# Patient Record
Sex: Female | Born: 1990 | Race: White | Hispanic: No | Marital: Single | Smoking: Never smoker
Health system: Southern US, Community
[De-identification: ages and names within clinical notes are randomized; demographics above are authoritative.]

## PROBLEM LIST (undated history)

## (undated) DIAGNOSIS — H539 Unspecified visual disturbance: Secondary | ICD-10-CM

## (undated) DIAGNOSIS — J069 Acute upper respiratory infection, unspecified: Secondary | ICD-10-CM

## (undated) DIAGNOSIS — L509 Urticaria, unspecified: Secondary | ICD-10-CM

## (undated) DIAGNOSIS — J4530 Mild persistent asthma, uncomplicated: Secondary | ICD-10-CM

## (undated) HISTORY — PX: SINOSCOPY: SHX187

## (undated) HISTORY — DX: Mild persistent asthma, uncomplicated: J45.30

## (undated) HISTORY — DX: Urticaria, unspecified: L50.9

## (undated) HISTORY — DX: Acute upper respiratory infection, unspecified: J06.9

## (undated) HISTORY — DX: Unspecified visual disturbance: H53.9

---

## 2010-12-30 HISTORY — PX: UPPER GI ENDOSCOPY: SHX6162

## 2016-04-16 ENCOUNTER — Encounter: Payer: Self-pay | Admitting: Allergy and Immunology

## 2016-04-16 ENCOUNTER — Ambulatory Visit (INDEPENDENT_AMBULATORY_CARE_PROVIDER_SITE_OTHER): Payer: BLUE CROSS/BLUE SHIELD | Admitting: Allergy and Immunology

## 2016-04-16 VITALS — BP 110/76 | HR 80 | Temp 98.1°F | Resp 16 | Ht 68.7 in | Wt 204.6 lb

## 2016-04-16 DIAGNOSIS — J3089 Other allergic rhinitis: Secondary | ICD-10-CM

## 2016-04-16 DIAGNOSIS — J339 Nasal polyp, unspecified: Secondary | ICD-10-CM | POA: Diagnosis not present

## 2016-04-16 DIAGNOSIS — J4530 Mild persistent asthma, uncomplicated: Secondary | ICD-10-CM

## 2016-04-16 MED ORDER — ALBUTEROL SULFATE 108 (90 BASE) MCG/ACT IN AEPB: 2.0000 | INHALATION_SPRAY | RESPIRATORY_TRACT | Status: AC | PRN

## 2016-04-16 MED ORDER — AZELASTINE HCL 0.15 % NA SOLN: NASAL | Status: AC

## 2016-04-16 MED ORDER — LEVOCETIRIZINE DIHYDROCHLORIDE 5 MG PO TABS: ORAL_TABLET | ORAL | Status: AC

## 2016-04-16 MED ORDER — MONTELUKAST SODIUM 10 MG PO TABS: ORAL_TABLET | ORAL | Status: AC

## 2016-04-16 NOTE — Assessment & Plan Note (Addendum)
   Aeroallergen avoidance measures have been discussed and provided in written form.  A prescription has been provided for azelastine nasal spray, 1-2 sprays per nostril 2 times daily as needed. Proper nasal spray technique has been discussed and demonstrated.   Continue fluticasone, 2 sprays per nostril daily as needed.  I have also recommended nasal saline spray (i.e. Simply Saline) as needed prior to medicated nasal sprays. A prescription has been provided for montelukast 10 mg daily at bedtime.  A prescription has been provided for levocetirizine, 5 mg daily as needed.  The risks and benefits of aeroallergen immunotherapy have been discussed. The patient is motivated to initiate immunotherapy to reduce symptoms and decrease medication requirement. Informed consent has been signed and allergen vaccine orders have been submitted. Medications will be decreased or discontinued as symptom relief from immunotherapy becomes evident.

## 2016-04-16 NOTE — Progress Notes (Signed)
New Patient Note  RE: Gina Ruiz MRN: [id number] DOB: [birth year] Date of Office Visit: 04/16/2016  Referring provider: Estanislado Pandy, MD Primary care provider: Estanislado Pandy, MD  Chief Complaint: Allergic Rhinitis  and Sinus Problem   History of present illness: HPI Comments: Gina Ruiz is a 25 y.o. female presenting today for consultation of rhinosinusitis.  Seen reports that she has suffered from allergies her "whole life."  She complains of nasal congestion, rhinorrhea, thick postnasal drainage, ear fullness, and sinus pressure over the forehead.  The symptoms occur year around but her most severe in the fall.  She had sinus surgery with polypectomy in March 2016.  Despite the surgery she has had 3 or 4 sinus infections requiring antibiotics and/or steroids since that time.  She has tried multiple antihistamines and prescription nasal sprays.  She reports that the only prescription nasal spray which provides adequate relief is Dymista, however her insurance does not cover this medication.  She currently uses fluticasone nasal spray without adequate symptom relief.  She had been started on aeroallergen immunotherapy last year but discontinued buildup injections in November due to her move from [place] to [place].  She has a history of migraine headaches which seem to be triggered by sinus pressure. She experiences chest tightness and wheezing with upper respiratory infections.   Assessment and plan: Perennial and seasonal allergic rhinitis  Aeroallergen avoidance measures have been discussed and provided in written form.  A prescription has been provided for azelastine nasal spray, 1-2 sprays per nostril 2 times daily as needed. Proper nasal spray technique has been discussed and demonstrated.   Continue fluticasone, 2 sprays per nostril daily as needed.  I have also recommended nasal saline spray (i.e. Simply Saline) as needed prior to medicated nasal sprays. A prescription  has been provided for montelukast 10 mg daily at bedtime.  A prescription has been provided for levocetirizine, 5 mg daily as needed.  The risks and benefits of aeroallergen immunotherapy have been discussed. The patient is motivated to initiate immunotherapy to reduce symptoms and decrease medication requirement. Informed consent has been signed and allergen vaccine orders have been submitted. Medications will be decreased or discontinued as symptom relief from immunotherapy becomes evident.  Mild persistent asthma Today's spirometry results, assessed while asymptomatic, suggest under-perception of dyspnea.  A prescription has been provided for montelukast (as above).  A prescription has been provided for ProAir Respiclick, 1-2 inhalations every 4-6 hours as needed.  Subjective and objective measures of pulmonary function will be followed and the treatment plan will be adjusted accordingly.  History of nasal polyps  Gina Ruiz has been encouraged to use nasal steroid spray daily.    Meds ordered this encounter  Medications  . Albuterol Sulfate (PROAIR RESPICLICK) 108 (90 Base) MCG/ACT AEPB    Sig: Inhale 2 puffs into the lungs every 4 (four) hours as needed. for cough or wheeze.  May use 2 puffs 10-20 minutes prior to exercise.    Dispense:  1 each    Refill:  1  . Azelastine HCl 0.15 % SOLN    Sig: Use 1-2 sprays in each nostril twice daily as needed for congestion.    Dispense:  30 mL    Refill:  5  . montelukast (SINGULAIR) 10 MG tablet    Sig: Take one tablet once daily in the evening to prevent cough or wheeze.    Dispense:  30 tablet    Refill:  5  . levocetirizine (XYZAL) 5 MG tablet  Sig: Take one tablet once daily for runny nose or itching.    Dispense:  30 tablet    Refill:  5    Diagnositics: Spirometry: FVC was 3.77 L (89% predicted) and FEV1 was 2.38 L (65% predicted) with significant (470 mL, 19%) postbronchodilator improvement. Allergy skin testing: Positive to  grass pollen, ragweed pollen, weed pollen, tree pollen, cat hair, dog epithelia, and dust mite antigen.    Physical examination: Blood pressure 110/76, pulse 80, temperature 98.1 F (36.7 C), temperature source Oral, resp. rate 16, height 5' 8.7" (1.745 m), weight 204 lb 9.4 oz (92.8 kg), SpO2 97 %.  General: Alert, interactive, in no acute distress. HEENT: TMs pearly gray, turbinates edematous without discharge, post-pharynx moderately erythematous. Neck: Supple without lymphadenopathy. Lungs: Clear to auscultation without wheezing, rhonchi or rales. CV: Normal S1, S2 without murmurs. Abdomen: Nondistended, nontender. Skin: Warm and dry, without lesions or rashes. Extremities:  No clubbing, cyanosis or edema. Neuro:   Grossly intact.  Review of systems:  Review of Systems  Constitutional: Negative for fever, chills and weight loss.  HENT: Positive for congestion. Negative for nosebleeds.   Eyes: Negative for blurred vision.  Respiratory: Positive for cough. Negative for hemoptysis, shortness of breath and wheezing.   Cardiovascular: Negative for chest pain.  Gastrointestinal: Negative for diarrhea and constipation.  Genitourinary: Negative for dysuria.  Musculoskeletal: Negative for myalgias and joint pain.  Skin: Negative for itching and rash.  Neurological: Positive for headaches. Negative for dizziness.  Endo/Heme/Allergies: Positive for environmental allergies. Does not bruise/bleed easily.    Past medical history:  Past Medical History  Diagnosis Date  . Recurrent upper respiratory infection (URI)   . Urticaria     Past surgical history:  Past Surgical History  Procedure Laterality Date  . Sinoscopy      REMOVED NASAL POLYPS  . Upper gi endoscopy  12/2010    Family history: Family History  Problem Relation Age of Onset  . Allergic rhinitis Father   . Asthma Neg Hx   . Angioedema Neg Hx   . Immunodeficiency Neg Hx   . Urticaria Neg Hx   . Food Allergy Child    . Eczema Child     Social history: Social History   Social History  . Marital Status: Single    Spouse Name: N/A  . Number of Children: N/A  . Years of Education: N/A   Occupational History  . Not on file.   Social History Main Topics  . Smoking status: Never Smoker   . Smokeless tobacco: Never Used  . Alcohol Use: Not on file  . Drug Use: Not on file  . Sexual Activity: Not on file   Other Topics Concern  . Not on file   Social History Narrative  . No narrative on file   Environmental History: The patient lives in a 25 year old house with carpeting in the bedroom, gas heat, and central air.  There is a dog in house which has access to her bedroom.  She is a nonsmoker and is not exposed to significant secondhand cigarette smoke.    Medication List       This list is accurate as of: 04/16/16  1:10 PM.  Always use your most recent med list.               Albuterol Sulfate 108 (90 Base) MCG/ACT Aepb  Commonly known as:  PROAIR RESPICLICK  Inhale 2 puffs into the lungs every 4 (four) hours as needed. for  cough or wheeze.  May use 2 puffs 10-20 minutes prior to exercise.     Azelastine HCl 0.15 % Soln  Use 1-2 sprays in each nostril twice daily as needed for congestion.     BENADRYL 25 mg capsule  Generic drug:  diphenhydrAMINE  Take 25 mg by mouth every 6 (six) hours as needed.     fluticasone 50 MCG/ACT nasal spray  Commonly known as:  FLONASE  Place 2 sprays into both nostrils daily.     levocetirizine 5 MG tablet  Commonly known as:  XYZAL  Take one tablet once daily for runny nose or itching.     montelukast 10 MG tablet  Commonly known as:  SINGULAIR  Take one tablet once daily in the evening to prevent cough or wheeze.     phentermine 37.5 MG tablet  Commonly known as:  ADIPEX-P  Take 37.5 mg by mouth daily.     prenatal multivitamin Tabs tablet  Take 1 tablet by mouth daily at 12 noon.        Known medication allergies: Allergies    Allergen Reactions  . Amoxicillin Anaphylaxis    I appreciate the opportunity to take part in this Azka's care. Please do not hesitate to contact me with questions.  Sincerely,   R. Jorene Guestarter Brittannie Tawney, MD

## 2016-04-16 NOTE — Assessment & Plan Note (Addendum)
Today's spirometry results, assessed while asymptomatic, suggest under-perception of dyspnea.  A prescription has been provided for montelukast (as above).  A prescription has been provided for ProAir Respiclick, 1-2 inhalations every 4-6 hours as needed.  Subjective and objective measures of pulmonary function will be followed and the treatment plan will be adjusted accordingly. 

## 2016-04-16 NOTE — Patient Instructions (Addendum)
Perennial and seasonal allergic rhinitis  Aeroallergen avoidance measures have been discussed and provided in written form.  A prescription has been provided for azelastine nasal spray, 1-2 sprays per nostril 2 times daily as needed. Proper nasal spray technique has been discussed and demonstrated.   Continue fluticasone, 2 sprays per nostril daily as needed.  I have also recommended nasal saline spray (i.e. Simply Saline) as needed prior to medicated nasal sprays. A prescription has been provided for montelukast 10 mg daily at bedtime.  A prescription has been provided for levocetirizine, 5 mg daily as needed.  The risks and benefits of aeroallergen immunotherapy have been discussed. The patient is motivated to initiate immunotherapy to reduce symptoms and decrease medication requirement. Informed consent has been signed and allergen vaccine orders have been submitted. Medications will be decreased or discontinued as symptom relief from immunotherapy becomes evident.  Mild persistent asthma Today's spirometry results, assessed while asymptomatic, suggest under-perception of dyspnea.  A prescription has been provided for montelukast (as above).  A prescription has been provided for ProAir Respiclick, 1-2 inhalations every 4-6 hours as needed.  Subjective and objective measures of pulmonary function will be followed and the treatment plan will be adjusted accordingly.  History of nasal polyps  Arline AspCindy has been encouraged to use nasal steroid spray daily.    Return in about 4 months (around 08/16/2016), or if symptoms worsen or fail to improve.  Reducing Pollen Exposure  The American Academy of Allergy, Asthma and Immunology suggests the following steps to reduce your exposure to pollen during allergy seasons.    1. Do not hang sheets or clothing out to dry; pollen may collect on these items. 2. Do not mow lawns or spend time around freshly cut grass; mowing stirs up pollen. 3. Keep  windows closed at night.  Keep car windows closed while driving. 4. Minimize morning activities outdoors, a time when pollen counts are usually at their highest. 5. Stay indoors as much as possible when pollen counts or humidity is high and on windy days when pollen tends to remain in the air longer. 6. Use air conditioning when possible.  Many air conditioners have filters that trap the pollen spores. 7. Use a HEPA room air filter to remove pollen form the indoor air you breathe.   Control of House Dust Mite Allergen  House dust mites play a major role in allergic asthma and rhinitis.  They occur in environments with high humidity wherever human skin, the food for dust mites is found. High levels have been detected in dust obtained from mattresses, pillows, carpets, upholstered furniture, bed covers, clothes and soft toys.  The principal allergen of the house dust mite is found in its feces.  A gram of dust may contain 1,000 mites and 250,000 fecal particles.  Mite antigen is easily measured in the air during house cleaning activities.    1. Encase mattresses, including the box spring, and pillow, in an air tight cover.  Seal the zipper end of the encased mattresses with wide adhesive tape. 2. Wash the bedding in water of 130 degrees Farenheit weekly.  Avoid cotton comforters/quilts and flannel bedding: the most ideal bed covering is the dacron comforter. 3. Remove all upholstered furniture from the bedroom. 4. Remove carpets, carpet padding, rugs, and non-washable window drapes from the bedroom.  Wash drapes weekly or use plastic window coverings. 5. Remove all non-washable stuffed toys from the bedroom.  Wash stuffed toys weekly. 6. Have the room cleaned frequently with a  vacuum cleaner and a damp dust-mop.  The patient should not be in a room which is being cleaned and should wait 1 hour after cleaning before going into the room. 7. Close and seal all heating outlets in the bedroom.  Otherwise,  the room will become filled with dust-laden air.  An electric heater can be used to heat the room. 8. Reduce indoor humidity to less than 50%.  Do not use a humidifier.  Control of Dog or Cat Allergen  Avoidance is the best way to manage a dog or cat allergy. If you have a dog or cat and are allergic to dog or cats, consider removing the dog or cat from the home. If you have a dog or cat but don't want to find it a new home, or if your family wants a pet even though someone in the household is allergic, here are some strategies that may help keep symptoms at bay:  1. Keep the pet out of your bedroom and restrict it to only a few rooms. Be advised that keeping the dog or cat in only one room will not limit the allergens to that room. 2. Don't pet, hug or kiss the dog or cat; if you do, wash your hands with soap and water. 3. High-efficiency particulate air (HEPA) cleaners run continuously in a bedroom or living room can reduce allergen levels over time. 4. Regular use of a high-efficiency vacuum cleaner or a central vacuum can reduce allergen levels. 5. Giving your dog or cat a bath at least once a week can reduce airborne allergen.

## 2016-04-16 NOTE — Assessment & Plan Note (Signed)
   Arline AspCindy has been encouraged to use nasal steroid spray daily.

## 2016-04-18 DIAGNOSIS — J301 Allergic rhinitis due to pollen: Secondary | ICD-10-CM

## 2016-04-19 DIAGNOSIS — J3089 Other allergic rhinitis: Secondary | ICD-10-CM

## 2016-08-27 ENCOUNTER — Other Ambulatory Visit: Payer: Self-pay | Admitting: Otolaryngology

## 2016-08-27 DIAGNOSIS — H9313 Tinnitus, bilateral: Secondary | ICD-10-CM

## 2016-09-26 ENCOUNTER — Encounter: Payer: Self-pay | Admitting: Radiology

## 2016-09-26 ENCOUNTER — Ambulatory Visit
Admission: RE | Admit: 2016-09-26 | Discharge: 2016-09-26 | Disposition: A | Discharge status: Home / Self Care | Payer: BLUE CROSS/BLUE SHIELD | Source: Ambulatory Visit | Attending: Otolaryngology | Admitting: Otolaryngology

## 2016-09-26 DIAGNOSIS — H9313 Tinnitus, bilateral: Secondary | ICD-10-CM

## 2016-09-26 MED ORDER — GADOBENATE DIMEGLUMINE 529 MG/ML IV SOLN
19.0000 mL | Freq: Once | INTRAVENOUS | Status: AC | PRN
  Administered 2016-09-26: 19 mL via INTRAVENOUS

## 2016-10-10 ENCOUNTER — Ambulatory Visit (INDEPENDENT_AMBULATORY_CARE_PROVIDER_SITE_OTHER): Payer: BLUE CROSS/BLUE SHIELD | Admitting: Neurology

## 2016-10-10 ENCOUNTER — Telehealth: Payer: Self-pay | Admitting: Neurology

## 2016-10-10 ENCOUNTER — Encounter: Payer: Self-pay | Admitting: Neurology

## 2016-10-10 DIAGNOSIS — IMO0002 Reserved for concepts with insufficient information to code with codable children: Secondary | ICD-10-CM | POA: Insufficient documentation

## 2016-10-10 DIAGNOSIS — G43709 Chronic migraine without aura, not intractable, without status migrainosus: Secondary | ICD-10-CM

## 2016-10-10 DIAGNOSIS — R42 Dizziness and giddiness: Secondary | ICD-10-CM | POA: Diagnosis not present

## 2016-10-10 MED ORDER — RIZATRIPTAN BENZOATE 5 MG PO TBDP: 5.0000 mg | ORAL_TABLET | ORAL | 6 refills | Status: AC | PRN

## 2016-10-10 MED ORDER — TOPIRAMATE 50 MG PO TABS: 50.0000 mg | ORAL_TABLET | Freq: Two times a day (BID) | ORAL | 11 refills | Status: AC

## 2016-10-10 NOTE — Telephone Encounter (Signed)
Please call patient, it is rarely happens.  we may follow-up on the triglycerides and uric acid level when she comes back, with she have frequent migraine and vertigo, it is worse for her to try

## 2016-10-10 NOTE — Telephone Encounter (Signed)
What would you like me to tell her?

## 2016-10-10 NOTE — Telephone Encounter (Signed)
LM with recommendations below. Left call back number for further questions.  

## 2016-10-10 NOTE — Telephone Encounter (Signed)
Pt called said she was reading trh side effects for topmax and it could cause high uric acid and high triglycerides and she already has both of these. Please call

## 2016-10-10 NOTE — Progress Notes (Signed)
PATIENT: Gina Ruiz DOB: 04/27/1991  Chief Complaint  Patient presents with  . Dizziness    Gina Ruiz is here for eval of intermittent dizziness onset about 2 yrs. ago.  Worse with position changes. Sts. no relief with Valium, Antivert or Brandt-Daroff exercises.Gina Ruiz     HISTORICAL  Gina Ruiz is a 25 years old right-handed female, seen in refer by her primary care PA Lovey NewcomerWilliam S Boyd for evaluation of frequent headaches, vertigo spells, initial evaluation was October twelfth 2017   She reported a long-standing history of migraine headaches since elementary school, for a while, she was treated with preventive medications, since 2015, she noticed increased frequency of headaches, almost daily mild to moderate headache, involving different part, lasting few minutes to hours, about once a month she would get her typical migraine severe pounding headache with associated light noise sensitivity, nauseous, she has to lie in dark quiet room, she often take over-the-counter BC powders, which helps her headache most of the time,  In addition to her frequent headaches she also complains of dizziness since 2015, gradually getting worse, especially since 2016, she had frequent set onset vertigo spells, nauseous, difficulty focusing, but only mild headache, no hearing loss, no tinnitus,  She was evaluated by ENT no significant pathology found, because of frequent vertigo, she presented to local emergency room multiple times, has taking many days off work, in one spell in September, she has to miss work 7 days in a row.  We personally reviewed MRI of the brain with and without contrast on September 26 2016, there was no significant abnormality.   REVIEW OF SYSTEMS: Full 14 system review of systems performed and notable only for dizziness, memory loss, headaches, allergy, frequent infection, easy bruising, blurry vision, snoring, spinning sensation, fatigue  ALLERGIES: Allergies  Allergen  Reactions  . Amoxicillin Anaphylaxis    HOME MEDICATIONS: Current Outpatient Prescriptions  Medication Sig Dispense Refill  . Albuterol Sulfate (PROAIR RESPICLICK) 108 (90 Base) MCG/ACT AEPB Inhale 2 puffs into the lungs every 4 (four) hours as needed. for cough or wheeze.  May use 2 puffs 10-20 minutes prior to exercise. 1 each 1  . fluticasone (FLONASE) 50 MCG/ACT nasal spray Place 2 sprays into both nostrils daily.    . Azelastine HCl 0.15 % SOLN Use 1-2 sprays in each nostril twice daily as needed for congestion. (Patient not taking: Reported on 10/10/2016) 30 mL 5  . diphenhydrAMINE (BENADRYL) 25 mg capsule Take 25 mg by mouth every 6 (six) hours as needed.    Marland Kitchen. levocetirizine (XYZAL) 5 MG tablet Take one tablet once daily for runny nose or itching. (Patient not taking: Reported on 10/10/2016) 30 tablet 5  . montelukast (SINGULAIR) 10 MG tablet Take one tablet once daily in the evening to prevent cough or wheeze. (Patient not taking: Reported on 10/10/2016) 30 tablet 5  . phentermine (ADIPEX-P) 37.5 MG tablet Take 37.5 mg by mouth daily.  0  . Prenatal Vit-Fe Fumarate-FA (PRENATAL MULTIVITAMIN) TABS tablet Take 1 tablet by mouth daily at 12 noon.     No current facility-administered medications for this visit.     PAST MEDICAL HISTORY: Past Medical History:  Diagnosis Date  . Mild persistent asthma   . Recurrent upper respiratory infection (URI)   . Urticaria   . Vision abnormalities     PAST SURGICAL HISTORY: Past Surgical History:  Procedure Laterality Date  . SINOSCOPY     REMOVED NASAL POLYPS  . UPPER GI ENDOSCOPY  12/2010    FAMILY HISTORY: Family History  Problem Relation Age of Onset  . Allergic rhinitis Father   . Healthy Father   . Food Allergy Child   . Eczema Child   . Healthy Mother   . Asthma Neg Hx   . Angioedema Neg Hx   . Immunodeficiency Neg Hx   . Urticaria Neg Hx     SOCIAL HISTORY:  Social History   Social History  . Marital status:  Single    Spouse name: N/A  . Number of children: N/A  . Years of education: N/A   Occupational History  . banker   Social History Main Topics  . Smoking status: Never Smoker  . Smokeless tobacco: Never Used  . Alcohol use Not on file  . Drug use: Unknown  . Sexual activity: Not on file   Other Topics Concern  . Not on file   Social History Narrative  . No narrative on file     PHYSICAL EXAM   Vitals:   10/10/16 0958  BP: 122/76  Pulse: 80  Resp: 16  Weight: 208 lb (94.3 kg)  Height: 5\' 9"  (1.753 m)    Not recorded      Body mass index is 30.72 kg/m.  PHYSICAL EXAMNIATION:  Gen: NAD, conversant, well nourised, obese, well groomed                     Cardiovascular: Regular rate rhythm, no peripheral edema, warm, nontender. Eyes: Conjunctivae clear without exudates or hemorrhage Neck: Supple, no carotid bruise. Pulmonary: Clear to auscultation bilaterally   NEUROLOGICAL EXAM:  MENTAL STATUS: Speech:    Speech is normal; fluent and spontaneous with normal comprehension.  Cognition:     Orientation to time, place and person     Normal recent and remote memory     Normal Attention span and concentration     Normal Language, naming, repeating,spontaneous speech     Fund of knowledge   CRANIAL NERVES: CN II: Visual fields are full to confrontation. Fundoscopic exam is normal with sharp discs and no vascular changes. Pupils are round equal and briskly reactive to light. CN III, IV, VI: extraocular movement are normal. No ptosis. CN V: Facial sensation is intact to pinprick in all 3 divisions bilaterally. Corneal responses are intact.  CN VII: Face is symmetric with normal eye closure and smile. CN VIII: Hearing is normal to rubbing fingers CN IX, X: Palate elevates symmetrically. Phonation is normal. CN XI: Head turning and shoulder shrug are intact CN XII: Tongue is midline with normal movements and no atrophy.  MOTOR: There is no pronator drift of  out-stretched arms. Muscle bulk and tone are normal. Muscle strength is normal.  REFLEXES: Reflexes are 2+ and symmetric at the biceps, triceps, knees, and ankles. Plantar responses are flexor.  SENSORY: Intact to light touch, pinprick, positional sensation and vibratory sensation are intact in fingers and toes.  COORDINATION: Rapid alternating movements and fine finger movements are intact. There is no dysmetria on finger-to-nose and heel-knee-shin.    GAIT/STANCE: Posture is normal. Gait is steady with normal steps, base, arm swing, and turning. Heel and toe walking are normal. Tandem gait is normal.  Romberg is absent.   DIAGNOSTIC DATA (LABS, IMAGING, TESTING) - I reviewed patient records, labs, notes, testing and imaging myself where available.   ASSESSMENT AND PLAN  Gina Ruiz is a 25 y.o. female   chronic migraine,  vertigo variant Normal MRI of  brain with without contrast Start preventive medications Topamax titrating to 100 mg twice a day Maxalt as needed Return to clinic in 2 months    Levert Feinstein, M.D. Ph.D.  [facility] [address], [address] [place], [place] [address] Ph: [phone] Fax: [phone]  CC: Lovey Newcomer, PA

## 2016-10-18 ENCOUNTER — Emergency Department (HOSPITAL_BASED_OUTPATIENT_CLINIC_OR_DEPARTMENT_OTHER)
Admission: EM | Admit: 2016-10-18 | Discharge: 2016-10-18 | Disposition: A | Discharge status: Home / Self Care | Payer: BLUE CROSS/BLUE SHIELD | Attending: Emergency Medicine | Admitting: Emergency Medicine

## 2016-10-18 ENCOUNTER — Encounter (HOSPITAL_BASED_OUTPATIENT_CLINIC_OR_DEPARTMENT_OTHER): Payer: Self-pay | Admitting: *Deleted

## 2016-10-18 ENCOUNTER — Emergency Department (HOSPITAL_BASED_OUTPATIENT_CLINIC_OR_DEPARTMENT_OTHER): Payer: BLUE CROSS/BLUE SHIELD

## 2016-10-18 DIAGNOSIS — R55 Syncope and collapse: Secondary | ICD-10-CM | POA: Diagnosis not present

## 2016-10-18 DIAGNOSIS — R0602 Shortness of breath: Secondary | ICD-10-CM | POA: Insufficient documentation

## 2016-10-18 DIAGNOSIS — R079 Chest pain, unspecified: Secondary | ICD-10-CM | POA: Diagnosis present

## 2016-10-18 LAB — CBC
HEMATOCRIT: 40.3 % (ref 36.0–46.0)
Hemoglobin: 13.6 g/dL (ref 12.0–15.0)
MCH: 29.5 pg (ref 26.0–34.0)
MCHC: 33.7 g/dL (ref 30.0–36.0)
MCV: 87.4 fL (ref 78.0–100.0)
PLATELETS: 330 10*3/uL (ref 150–400)
RBC: 4.61 MIL/uL (ref 3.87–5.11)
RDW: 12.8 % (ref 11.5–15.5)
WBC: 8.4 10*3/uL (ref 4.0–10.5)

## 2016-10-18 LAB — BASIC METABOLIC PANEL
Anion gap: 6 (ref 5–15)
BUN: 12 mg/dL (ref 6–20)
CO2: 27 mmol/L (ref 22–32)
CREATININE: 0.72 mg/dL (ref 0.44–1.00)
Calcium: 9.5 mg/dL (ref 8.9–10.3)
Chloride: 104 mmol/L (ref 101–111)
GFR calc Af Amer: >60 mL/min (ref >60)
GLUCOSE: 109 mg/dL — AB (ref 65–99)
POTASSIUM: 4.1 mmol/L (ref 3.5–5.1)
SODIUM: 137 mmol/L (ref 135–145)

## 2016-10-18 LAB — TROPONIN I: Troponin I: <0.03 ng/mL (ref <0.03)

## 2016-10-18 LAB — HCG, SERUM, QUALITATIVE: Preg, Serum: NEGATIVE

## 2016-10-18 LAB — D-DIMER, QUANTITATIVE (NOT AT ARMC)

## 2016-10-18 NOTE — ED Notes (Signed)
Patient transported to X-ray 

## 2016-10-18 NOTE — ED Provider Notes (Signed)
[facility] Provider Note   CSN: [id number] Arrival date & time: 10/18/16  1306     History   Chief Complaint Chief Complaint  Patient presents with  . Chest Pain    HPI Gina Ruiz is a 25 y.o. female.  HPI Patient presents from Urgent care she will rule out PE.Patient states that she was at work today at 11:30 and felt a "jolt' to her central chest. She developed shortness of breath, lightheadedness, blurred vision and near syncope.she denied any chest pain. She states that er left arm became tingly. She states she's been having episodic leg cramping for the past few days. Denies any lower extremity swelling. States she continues to have lightheadedness and shortness of breath. Last weekend drove 5 hoursover 2 consecutive days for a total of 10 hours. She's not on any oral birth control.She does have a grandfathers had a blood clot in his leg previously.She has no personal history for thromboembolic disease. Denies cough, fever or chills. Past Medical History:  Diagnosis Date  . Mild persistent asthma   . Recurrent upper respiratory infection (URI)   . Urticaria   . Vision abnormalities     Patient Active Problem List   Diagnosis Date Noted  . Chronic migraine 10/10/2016  . Vertigo 10/10/2016  . Perennial and seasonal allergic rhinitis 04/16/2016  . Mild persistent asthma 04/16/2016  . History of nasal polyps 04/16/2016    Past Surgical History:  Procedure Laterality Date  . SINOSCOPY     REMOVED NASAL POLYPS  . UPPER GI ENDOSCOPY  12/2010    OB History    No data available       Home Medications    Prior to Admission medications   Medication Sig Start Date End Date Taking? Authorizing Provider  Albuterol Sulfate (PROAIR RESPICLICK) 108 (90 Base) MCG/ACT AEPB Inhale 2 puffs into the lungs every 4 (four) hours as needed. for cough or wheeze.  May use 2 puffs 10-20 minutes prior to exercise. 04/16/16   Cristal Ford, MD  Azelastine  HCl 0.15 % SOLN Use 1-2 sprays in each nostril twice daily as needed for congestion. Patient not taking: Reported on 10/10/2016 04/16/16   Cristal Ford, MD  diphenhydrAMINE (BENADRYL) 25 mg capsule Take 25 mg by mouth every 6 (six) hours as needed.    Historical Provider, MD  fluticasone (FLONASE) 50 MCG/ACT nasal spray Place 2 sprays into both nostrils daily.    Historical Provider, MD  levocetirizine (XYZAL) 5 MG tablet Take one tablet once daily for runny nose or itching. Patient not taking: Reported on 10/10/2016 04/16/16   Cristal Ford, MD  montelukast (SINGULAIR) 10 MG tablet Take one tablet once daily in the evening to prevent cough or wheeze. Patient not taking: Reported on 10/10/2016 04/16/16   Cristal Ford, MD  phentermine (ADIPEX-P) 37.5 MG tablet Take 37.5 mg by mouth daily. 02/05/16   Historical Provider, MD  Prenatal Vit-Fe Fumarate-FA (PRENATAL MULTIVITAMIN) TABS tablet Take 1 tablet by mouth daily at 12 noon.    Historical Provider, MD  rizatriptan (MAXALT-MLT) 5 MG disintegrating tablet Take 1 tablet (5 mg total) by mouth as needed. May repeat in 2 hours if needed 10/10/16   Levert Feinstein, MD  topiramate (TOPAMAX) 50 MG tablet Take 1 tablet (50 mg total) by mouth 2 (two) times daily. 10/10/16   Levert Feinstein, MD    Family History Family History  Problem Relation Age of Onset  . Allergic rhinitis Father   .  Healthy Father   . Food Allergy Child   . Eczema Child   . Healthy Mother   . Asthma Neg Hx   . Angioedema Neg Hx   . Immunodeficiency Neg Hx   . Urticaria Neg Hx     Social History Social History  Substance Use Topics  . Smoking status: Never Smoker  . Smokeless tobacco: Never Used  . Alcohol use Not on file     Allergies   Amoxicillin   Review of Systems Review of Systems  Constitutional: Negative for chills, fatigue and fever.  Respiratory: Positive for shortness of breath. Negative for cough and wheezing.   Cardiovascular: Negative for  chest pain, palpitations and leg swelling.  Gastrointestinal: Negative for abdominal pain, diarrhea and nausea.  Genitourinary: Negative for dysuria, flank pain and frequency.  Musculoskeletal: Negative for back pain and neck pain.  Skin: Negative for pallor and wound.  Neurological: Positive for dizziness and light-headedness. Negative for weakness, numbness and headaches.  All other systems reviewed and are negative.    Physical Exam Updated Vital Signs BP 101/81 (BP Location: Right Arm)   Pulse 89   Temp 98.1 F (36.7 C) (Oral)   Resp 18   Ht 5\' 9"  (1.753 m)   Wt 208 lb (94.3 kg)   LMP 09/09/2016   SpO2 100%   BMI 30.72 kg/m   Physical Exam  Constitutional: She is oriented to person, place, and time. She appears well-developed and well-nourished.  HENT:  Head: Normocephalic and atraumatic.  Mouth/Throat: Oropharynx is clear and moist.  Eyes: EOM are normal. Pupils are equal, round, and reactive to light.  Neck: Normal range of motion. Neck supple.  Cardiovascular: Normal rate, regular rhythm, normal heart sounds and intact distal pulses.  Exam reveals no gallop and no friction rub.   No murmur heard. Pulmonary/Chest: Effort normal and breath sounds normal. No respiratory distress. She has no wheezes. She has no rales. She exhibits no tenderness.  Abdominal: Soft. Bowel sounds are normal. There is no tenderness. There is no rebound and no guarding.  Musculoskeletal: Normal range of motion. She exhibits no edema or tenderness.  No lower extremity swelling, asymmetry or tenderness. 2+ dorsalis pedis and posterior tibial pulses.  Neurological: She is alert and oriented to person, place, and time.  5/5 motor in all extremities. Sensation is fully intact.  Skin: Skin is warm and dry. Capillary refill takes less than 2 seconds. No rash noted. No erythema.  Psychiatric: She has a normal mood and affect. Her behavior is normal.  Nursing note and vitals reviewed.    ED  Treatments / Results  Labs (all labs ordered are listed, but only abnormal results are displayed) Labs Reviewed  BASIC METABOLIC PANEL - Abnormal; Notable for the following:       Result Value   Glucose, Bld 109 (*)    All other components within normal limits  CBC  TROPONIN I  D-DIMER, QUANTITATIVE (NOT [facility])  HCG, SERUM, QUALITATIVE    EKG  EKG Interpretation  Date/Time:  Friday October 18 2016 13:12:33 EDT Ventricular Rate:  85 PR Interval:  138 QRS Duration: 86 QT Interval:  378 QTC Calculation: 449 R Axis:   30 Text Interpretation:  Normal sinus rhythm Normal ECG Confirmed by Ranae PalmsYELVERTON  MD, Justyce Baby ([id number]) on 10/18/2016 3:11:29 PM       Radiology Dg Chest 2 View  Result Date: 10/18/2016 CLINICAL DATA:  Chest pain, shortness of breath, left arm tingling EXAM: CHEST  2  VIEW COMPARISON:  09/10/2016 FINDINGS: The heart size and mediastinal contours are within normal limits. Both lungs are clear. The visualized skeletal structures are unremarkable. IMPRESSION: No active cardiopulmonary disease. Electronically Signed   By: Natasha Mead M.D.   On: 10/18/2016 15:35    Procedures Procedures (including critical care time)  Medications Ordered in ED Medications - No data to display   Initial Impression / Assessment and Plan / ED Course  I have reviewed the triage vital signs and the nursing notes.  Pertinent labs & imaging results that were available during my care of the patient were reviewed by me and considered in my medical decision making (see chart for details).  Clinical Course   Workup is essentially negative. No cause for the patient's symptoms determined. EKG without any abnormal findings. Normal troponin and d-dimer. Electrolytes are normal. Advised follow-up with her primary physician and return precautions given. Advised drinking plenty fluids and change positions slowly.   Final Clinical Impressions(s) / ED Diagnoses   Final diagnoses:  Near syncope     New Prescriptions Discharge Medication List as of 10/18/2016  5:09 PM       Loren Racer, MD 10/18/16 2352

## 2016-10-18 NOTE — ED Triage Notes (Signed)
She went to UC today with chest pain, feeling she could not breathe and episode of getting hot. She had and EKG and her left arm got tingly, cramps in her left leg. She was told to come here to r/o blood clot. States she drove 10 hours in the car over the weekend.

## 2016-12-17 ENCOUNTER — Ambulatory Visit: Payer: BLUE CROSS/BLUE SHIELD | Admitting: Neurology

## 2016-12-17 ENCOUNTER — Telehealth: Payer: Self-pay | Admitting: *Deleted

## 2016-12-17 NOTE — Telephone Encounter (Signed)
No showed follow up appointment. 

## 2016-12-18 ENCOUNTER — Encounter: Payer: Self-pay | Admitting: Neurology

## 2017-06-11 IMAGING — CR DG CHEST 2V
2 series · 2 of 2 positions shown · non-contrast
Comparison: 09/10/2016

CLINICAL DATA: Chest pain, shortness of breath, left arm tingling

EXAM:
CHEST  2 VIEW

[w chest pa]
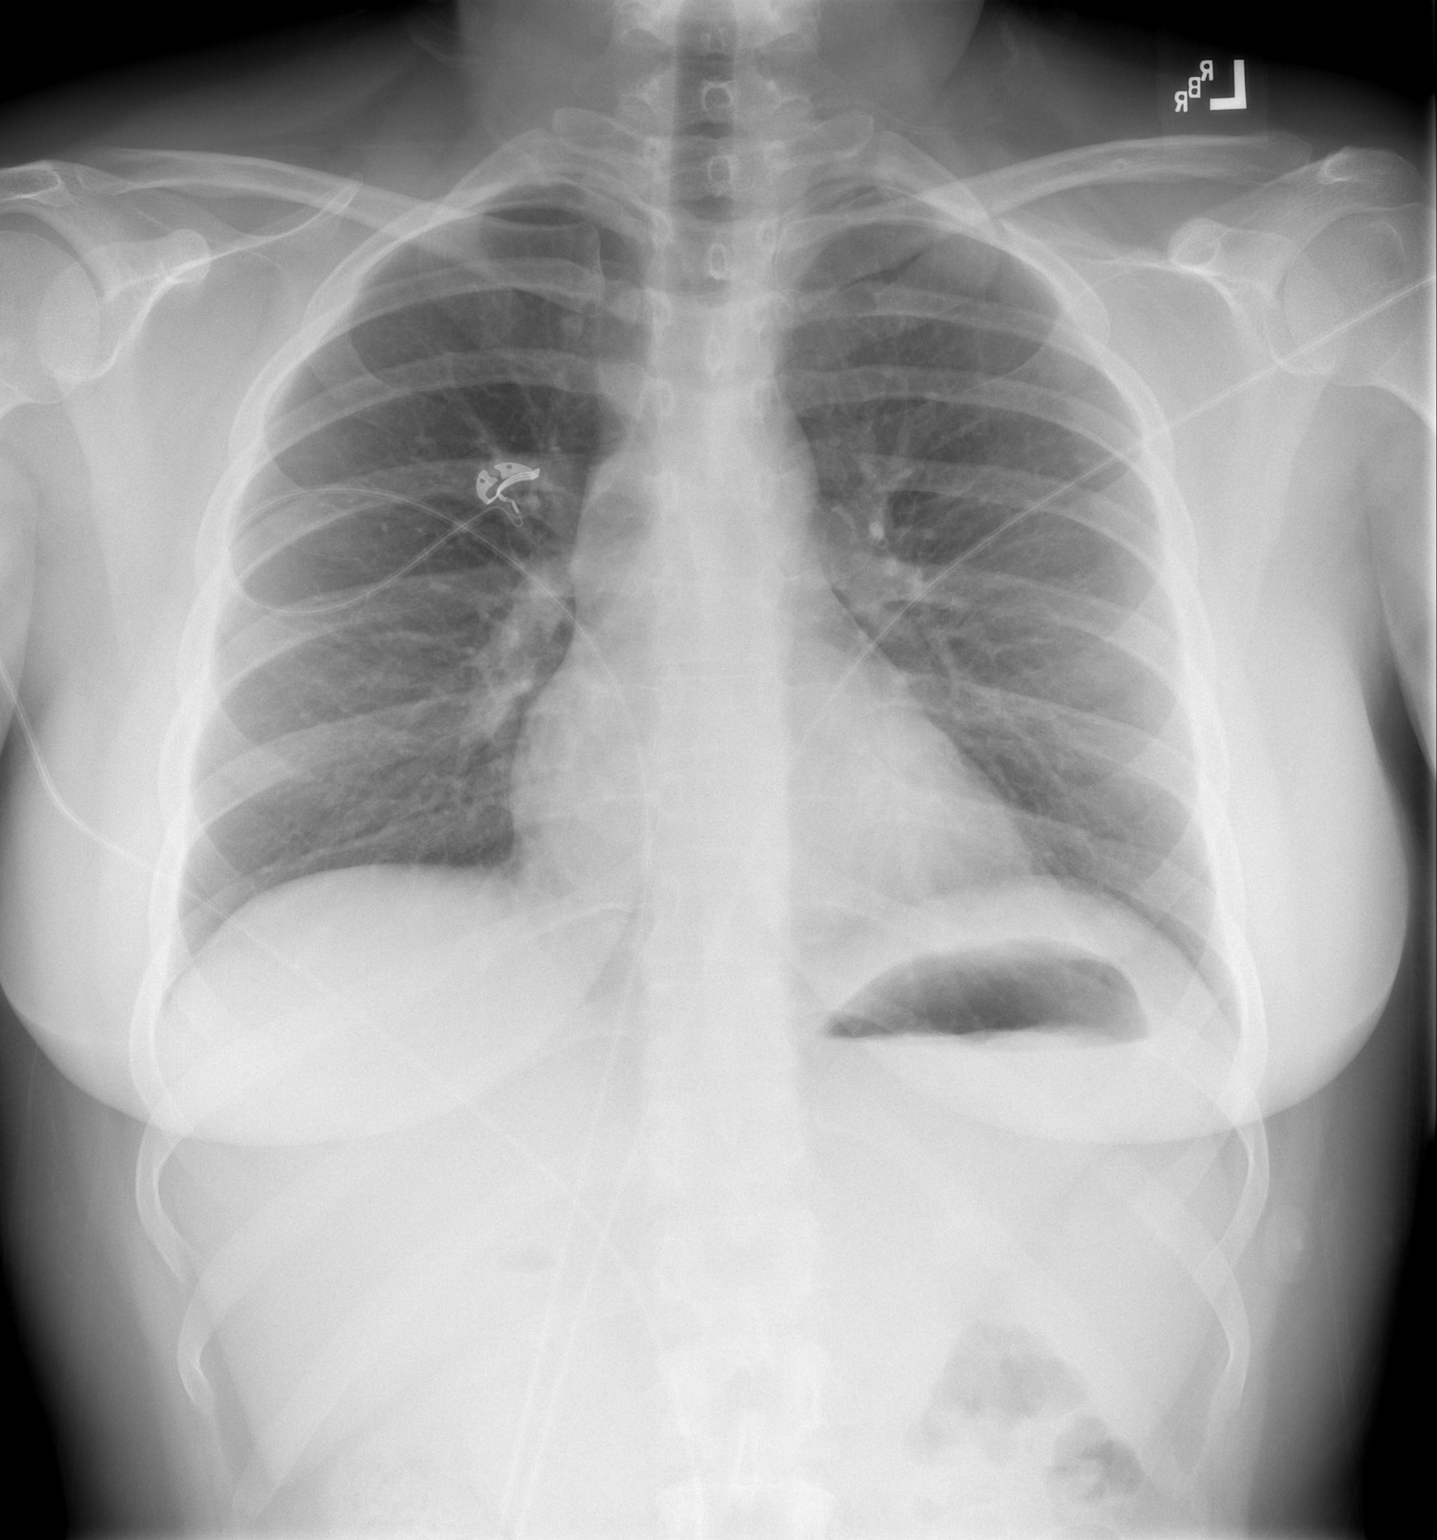

[w chest lat]
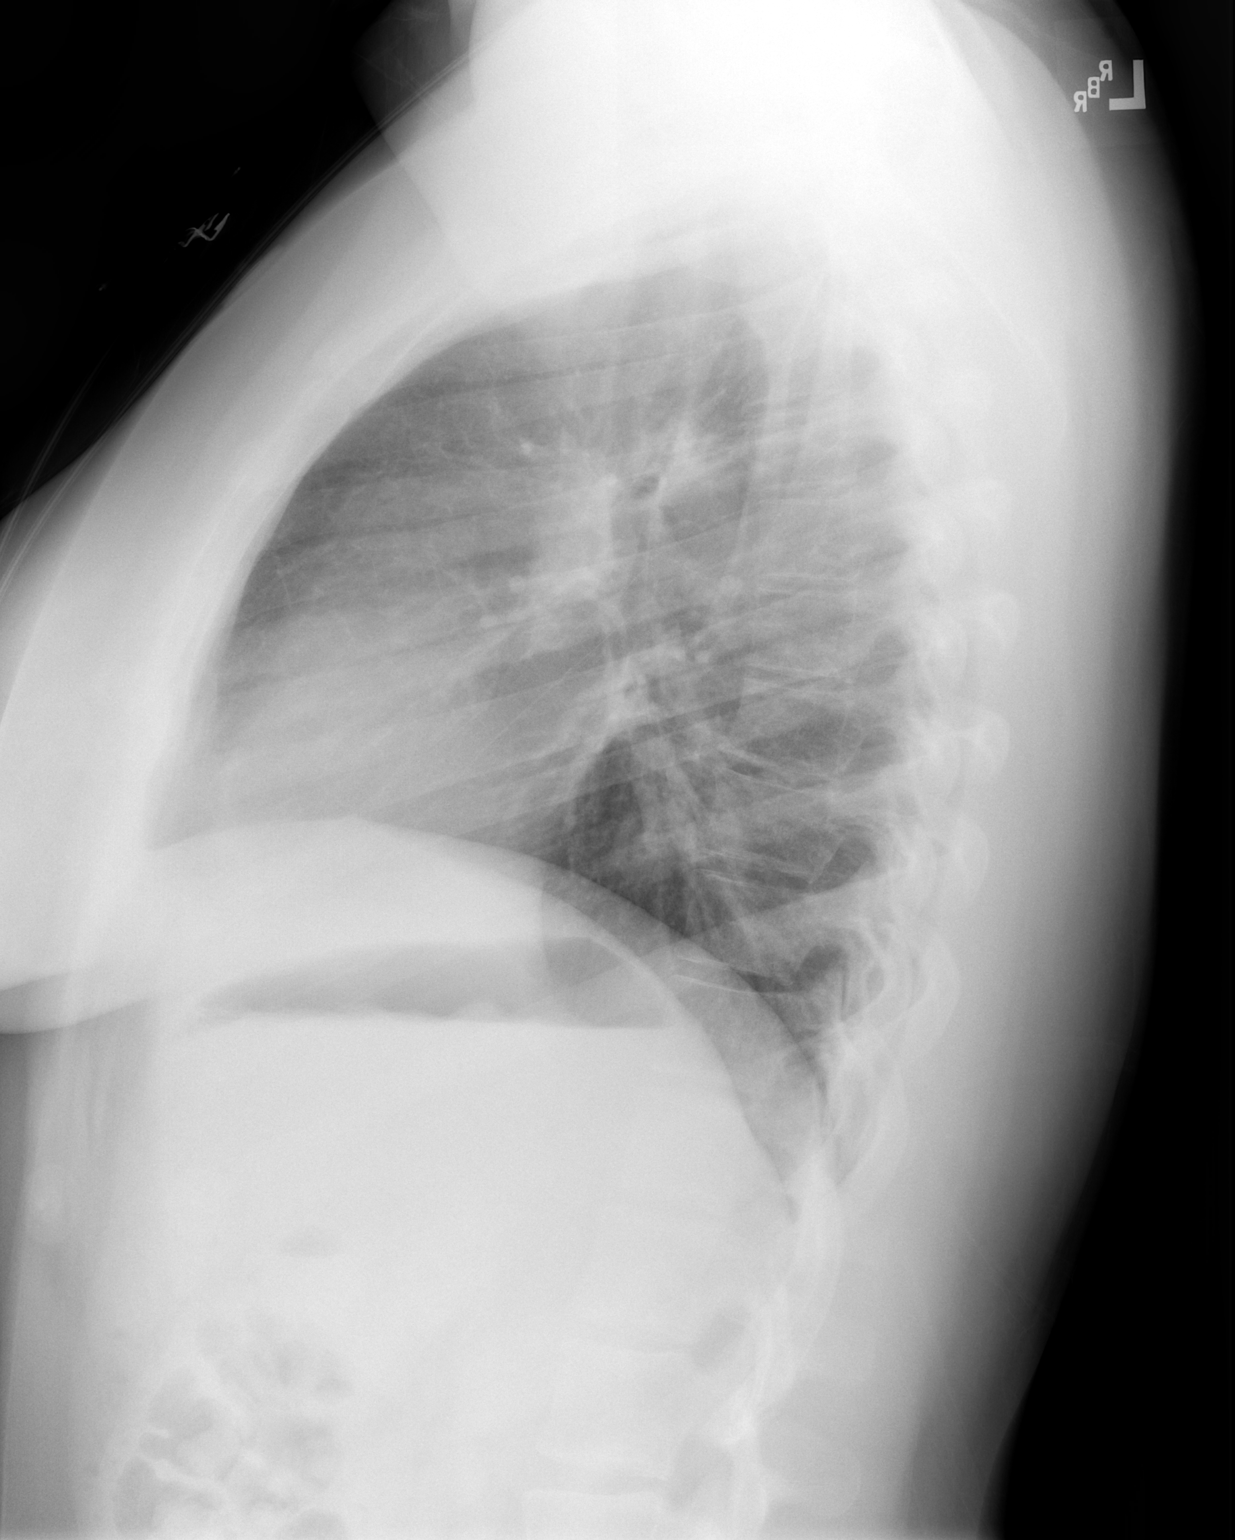

[2 of 2 positions shown; findings below may reference images not displayed]

FINDINGS: The heart size and mediastinal contours are within normal limits.
Both lungs are clear. The visualized skeletal structures are
unremarkable.
IMPRESSION: No active cardiopulmonary disease.
